# Patient Record
Sex: Male | Born: 1995 | Race: Black or African American | Hispanic: No | Marital: Single | Smoking: Former smoker
Health system: Southern US, Community
[De-identification: ages and names within clinical notes are randomized; demographics above are authoritative.]

---

## 2017-06-22 ENCOUNTER — Other Ambulatory Visit: Payer: Self-pay

## 2017-06-22 ENCOUNTER — Encounter (HOSPITAL_COMMUNITY): Payer: Self-pay

## 2017-06-22 ENCOUNTER — Emergency Department (HOSPITAL_COMMUNITY): Payer: Medicaid Other

## 2017-06-22 ENCOUNTER — Emergency Department (HOSPITAL_COMMUNITY)
Admission: EM | Admit: 2017-06-22 | Discharge: 2017-06-22 | Disposition: A | Discharge status: Home / Self Care | Payer: Medicaid Other | Attending: Emergency Medicine | Admitting: Emergency Medicine

## 2017-06-22 DIAGNOSIS — Z87891 Personal history of nicotine dependence: Secondary | ICD-10-CM | POA: Insufficient documentation

## 2017-06-22 DIAGNOSIS — J069 Acute upper respiratory infection, unspecified: Secondary | ICD-10-CM | POA: Diagnosis not present

## 2017-06-22 DIAGNOSIS — J029 Acute pharyngitis, unspecified: Secondary | ICD-10-CM | POA: Diagnosis present

## 2017-06-22 LAB — RAPID STREP SCREEN (MED CTR MEBANE ONLY): Streptococcus, Group A Screen (Direct): NEGATIVE

## 2017-06-22 MED ORDER — LIDOCAINE VISCOUS 2 % MT SOLN: 15.0000 mL | OROMUCOSAL | 0 refills | Status: AC | PRN

## 2017-06-22 MED ORDER — BENZONATATE 100 MG PO CAPS: 200.0000 mg | ORAL_CAPSULE | Freq: Three times a day (TID) | ORAL | 0 refills | Status: AC

## 2017-06-22 NOTE — Discharge Instructions (Signed)
Please read the attached information regarding your condition. Take Tessalon Perles as needed for cough. Swish and spit lidocaine solution as needed for sore throat. Take ibuprofen or Tylenol as needed for pain and discomfort. Return to ED for worsening sore throat, trouble breathing, trouble swallowing, productive cough, high fevers.

## 2017-06-22 NOTE — ED Provider Notes (Signed)
[facility]-EMERGENCY DEPT Provider Note   CSN: [id number] Arrival date & time: 06/22/17  0901     History   Chief Complaint Chief Complaint  Patient presents with  . Sore Throat  . Cough  . Nausea    HPI Alexander Shaffer is a 21 y.o. male with no significant past medical history, who presents to ED for evaluation of sore throat, cough, right ear pressure and intermittent nausea for the past 3 days.  States that his throat feels scratchy.  He states that his roommate has similar symptoms.  He has tried over-the-counter Zicam with mild relief in his symptoms.  States that he feels like there is drainage down his throat.  He is also been experiencing intermittent chills.  He denies any trouble breathing, trouble swallowing, neck pain, vomiting, abdominal pain, hemoptysis, chest pain or wheezing.  HPI  History reviewed. No pertinent past medical history.  There are no active problems to display for this patient.   History reviewed. No pertinent surgical history.     Home Medications    Prior to Admission medications   Medication Sig Start Date End Date Taking? Authorizing Provider  benzonatate (TESSALON) 100 MG capsule Take 2 capsules (200 mg total) every 8 (eight) hours by mouth. 06/22/17   Sayeed Weatherall, PA-C  lidocaine (XYLOCAINE) 2 % solution Use as directed 15 mLs as needed in the mouth or throat for mouth pain. 06/22/17   Dietrich PatesKhatri, Kahlil Cowans, PA-C    Family History Family History  Problem Relation Age of Onset  . High Cholesterol Mother     Social History Social History   Tobacco Use  . Smoking status: Former Games developermoker  . Smokeless tobacco: Never Used  Substance Use Topics  . Alcohol use: Yes    Comment: rarely  . Drug use: No     Allergies   Patient has no known allergies.   Review of Systems Review of Systems  Constitutional: Positive for chills. Negative for appetite change and fever.  HENT: Positive for ear pain, postnasal drip,  rhinorrhea and sore throat. Negative for drooling, ear discharge, sneezing, trouble swallowing and voice change.   Eyes: Negative for pain and itching.  Respiratory: Positive for cough. Negative for chest tightness, shortness of breath and wheezing.   Cardiovascular: Negative for chest pain.  Gastrointestinal: Positive for nausea. Negative for abdominal pain, constipation, diarrhea and vomiting.  Skin: Negative for rash.  Neurological: Negative for headaches.     Physical Exam Updated Vital Signs BP 136/77 (BP Location: Left Arm)   Pulse 76   Temp 98.7 F (37.1 C) (Oral)   Resp 16   Ht 6\' 1"  (1.854 m)   Wt 72.6 kg (160 lb)   SpO2 99%   BMI 21.11 kg/m   Physical Exam  Constitutional: He appears well-developed and well-nourished. No distress.  Nontoxic appearing and in no acute distress.  No signs of respiratory distress or airway compromise.  HENT:  Head: Normocephalic and atraumatic.  Right Ear: Tympanic membrane normal.  Left Ear: Tympanic membrane normal.  Nose: Mucosal edema present.  Mouth/Throat: Uvula is midline and mucous membranes are normal. Posterior oropharyngeal erythema present. No posterior oropharyngeal edema. No tonsillar exudate.  Patient does not appear to be in acute distress. No trismus or drooling present. No pooling of secretions. Patient is tolerating secretions and is not in respiratory distress. No neck pain or tenderness to palpation of the neck. Full active and passive range of motion of the neck. No evidence of  RPA or PTA.  Eyes: Conjunctivae and EOM are normal. No scleral icterus.  Neck: Normal range of motion.  Cardiovascular: Normal rate, regular rhythm and normal heart sounds.  Pulmonary/Chest: Effort normal. No respiratory distress.  Mild coarse breath sounds noted in right middle field.  Neurological: He is alert.  Skin: No rash noted. He is not diaphoretic.  Psychiatric: He has a normal mood and affect.  Nursing note and vitals  reviewed.    ED Treatments / Results  Labs (all labs ordered are listed, but only abnormal results are displayed) Labs Reviewed  RAPID STREP SCREEN (NOT AT [facility])  CULTURE, GROUP A STREP [facility])    EKG  EKG Interpretation None       Radiology Dg Chest 2 View  Result Date: 06/22/2017 CLINICAL DATA:  Cough with chest soreness and shortness breath worsening over last 2-3 days. EXAM: CHEST  2 VIEW COMPARISON:  None. FINDINGS: The heart size and mediastinal contours are normal. The lungs are clear. There is no pleural effusion or pneumothorax. No acute osseous findings are identified. IMPRESSION: No active cardiopulmonary process. Electronically Signed   By: Carey BullocksWilliam  Veazey M.D.   On: 06/22/2017 12:30    Procedures Procedures (including critical care time)  Medications Ordered in ED Medications - No data to display   Initial Impression / Assessment and Plan / ED Course  I have reviewed the triage vital signs and the nursing notes.  Pertinent labs & imaging results that were available during my care of the patient were reviewed by me and considered in my medical decision making (see chart for details).     Patient presents to ED for evaluation of URI symptoms including sore throat, cough, right ear pressure and intermittent nausea for the past 3 days.  On physical exam he is nontoxic appearing and in no acute distress.  He has no signs of respiratory distress or airway compromise.  He has full active and passive range of motion of the neck with mild erythema of the posterior oropharynx.  I have low suspicion for RPA or PTA being the cause of his sore throat.  Mild coarse breath sounds noted on right side.  He is afebrile with no history of fever.  Strep test returned as negative.  Chest x-ray returned as negative.  Symptoms most likely due to viral URI.  Will place patient on symptomatic treatment with Tessalon Perles and lidocaine solution as needed for throat discomfort.  Encourage  fluids and symptomatic treatment with over-the-counter medications.  Patient appears stable for discharge at this time.  Strict return precautions given.  Final Clinical Impressions(s) / ED Diagnoses   Final diagnoses:  Viral upper respiratory tract infection    ED Discharge Orders        Ordered    benzonatate (TESSALON) 100 MG capsule  Every 8 hours     06/22/17 1248    lidocaine (XYLOCAINE) 2 % solution  As needed     06/22/17 1248       Dietrich PatesKhatri, Deloris Mittag, PA-C 06/22/17 1250    Wynetta FinesMessick, Peter C, MD 06/22/17 1717

## 2017-06-22 NOTE — ED Triage Notes (Signed)
Patient c/o sore throat, nausea and a non productive cough x 3-4 days.

## 2017-06-24 LAB — CULTURE, GROUP A STREP (THRC)

## 2018-01-02 ENCOUNTER — Other Ambulatory Visit: Payer: Self-pay

## 2018-01-02 ENCOUNTER — Emergency Department (HOSPITAL_COMMUNITY)
Admission: EM | Admit: 2018-01-02 | Discharge: 2018-01-02 | Disposition: A | Discharge status: Home / Self Care | Payer: BLUE CROSS/BLUE SHIELD | Attending: Emergency Medicine | Admitting: Emergency Medicine

## 2018-01-02 ENCOUNTER — Encounter (HOSPITAL_COMMUNITY): Payer: Self-pay

## 2018-01-02 DIAGNOSIS — J029 Acute pharyngitis, unspecified: Secondary | ICD-10-CM | POA: Insufficient documentation

## 2018-01-02 DIAGNOSIS — Z87891 Personal history of nicotine dependence: Secondary | ICD-10-CM | POA: Diagnosis not present

## 2018-01-02 LAB — GROUP A STREP BY PCR: GROUP A STREP BY PCR: NOT DETECTED

## 2018-01-02 MED ORDER — DEXAMETHASONE SODIUM PHOSPHATE 10 MG/ML IJ SOLN
10.0000 mg | Freq: Once | INTRAMUSCULAR | Status: AC
  Administered 2018-01-02: 10 mg via INTRAMUSCULAR
  Filled 2018-01-02: qty 1

## 2018-01-02 MED ORDER — ACETAMINOPHEN 500 MG PO TABS: 500.0000 mg | ORAL_TABLET | Freq: Four times a day (QID) | ORAL | 0 refills | Status: AC | PRN

## 2018-01-02 MED ORDER — IBUPROFEN 600 MG PO TABS: 600.0000 mg | ORAL_TABLET | Freq: Four times a day (QID) | ORAL | 0 refills | Status: AC | PRN

## 2018-01-02 NOTE — ED Triage Notes (Signed)
Pt with right sided throat pain for 3-4 days.  Pain radiating to ear.  No sinus drainage.  No dental pain.  Denies fever.  Temp 99.3 in triage.

## 2018-01-02 NOTE — Discharge Instructions (Addendum)
Your strep test today was negative.  You likely have a viral pharyngitis causing your sore throat. Drink plenty of water and get plenty of rest. Gargle warm salt water and spit it out for sore throat. May also use cough drops, warm teas, over-the-counter Chloraseptic spray etc. Alternate 600 mg of ibuprofen and [phone] mg of Tylenol every 3 hours as needed for pain or fever. Do not exceed 4000 mg of Tylenol daily.  Take ibuprofen with food to avoid upset stomach issues.   Followup with your primary care doctor in 5-7 days for recheck of ongoing symptoms. Return to emergency department for emergent changing or worsening of symptoms such as throat tightness, facial swelling, fever not controlled by ibuprofen or Tylenol,difficulty breathing, or chest pain.

## 2018-01-02 NOTE — ED Provider Notes (Signed)
[facility]-EMERGENCY DEPT Provider Note   CSN: [id number] Arrival date & time: 01/02/18  1247     History   Chief Complaint Chief Complaint  Patient presents with  . Sore Throat    HPI Renaud Celli is a 22 y.o. male with no significant past medical history presents today for evaluation of acute onset, per aggressively worsening sore throat for 3 to 4 days.  Pain is worse on the right side with radiation to the right ear.  He endorses pain with swallowing but denies difficulty swallowing, drooling, facial swelling, or throat tightness.  No fevers or chills.  He denies nasal congestion, cough, shortness of breath, or chest pain.  He has tried DayQuil without significant relief of his symptoms, ibuprofen has been helpful in relieving pain.  He notes that his roommate recently had a sore throat for a day or so.  The history is provided by the patient.    History reviewed. No pertinent past medical history.  There are no active problems to display for this patient.   History reviewed. No pertinent surgical history.      Home Medications    Prior to Admission medications   Medication Sig Start Date End Date Taking? Authorizing Provider  Pseudoephedrine-APAP-DM (DAYQUIL PO) Take 30 mLs by mouth as needed (colds).   Yes [provider]  acetaminophen (TYLENOL) 500 MG tablet Take 1 tablet (500 mg total) by mouth every 6 (six) hours as needed. 01/02/18   Heavenlee Maiorana A, PA-C  benzonatate (TESSALON) 100 MG capsule Take 2 capsules (200 mg total) every 8 (eight) hours by mouth. Patient not taking: Reported on 01/02/2018 06/22/17   Dietrich Pates, PA-C  ibuprofen (ADVIL,MOTRIN) 600 MG tablet Take 1 tablet (600 mg total) by mouth every 6 (six) hours as needed. 01/02/18   Earnie Rockhold A, PA-C  lidocaine (XYLOCAINE) 2 % solution Use as directed 15 mLs as needed in the mouth or throat for mouth pain. Patient not taking: Reported on 01/02/2018 06/22/17   Dietrich Pates, PA-C      Family History Family History  Problem Relation Age of Onset  . High Cholesterol Mother     Social History Social History   Tobacco Use  . Smoking status: Former Games developer  . Smokeless tobacco: Never Used  Substance Use Topics  . Alcohol use: Yes    Comment: rarely  . Drug use: No     Allergies   Patient has no known allergies.   Review of Systems Review of Systems  Constitutional: Negative for chills and fever.  HENT: Positive for ear pain and sore throat. Negative for congestion and trouble swallowing.   Respiratory: Negative for shortness of breath.   Cardiovascular: Negative for chest pain.     Physical Exam Updated Vital Signs Pulse 96   Temp 99.3 F (37.4 C) (Oral)   Resp 16   Ht 6' 1.5" (1.867 m)   Wt 73.5 kg (162 lb)   SpO2 100%   BMI 21.08 kg/m   Physical Exam  Constitutional: He appears well-developed and well-nourished. No distress.  Resting comfortably in chair  HENT:  Head: Normocephalic and atraumatic.  Right Ear: Hearing and ear canal normal. No tenderness. A middle ear effusion is present.  Left Ear: Hearing, tympanic membrane and ear canal normal. No tenderness.  No middle ear effusion.  Mouth/Throat: Uvula is midline and mucous membranes are normal. Posterior oropharyngeal erythema present. Tonsils are 3+ on the right. Tonsils are 3+ on the left. No  tonsillar exudate.  Tolerating secretions without difficulty.  No frontal or maxillary sinus tenderness.  Eyes: Pupils are equal, round, and reactive to light. Conjunctivae and EOM are normal. Right eye exhibits no discharge. Left eye exhibits no discharge.  Neck: Normal range of motion and full passive range of motion without pain. Neck supple. No JVD present. No tracheal deviation present.  Right anterior cervical lymphadenopathy  Cardiovascular: Normal rate, regular rhythm and normal heart sounds.  Pulmonary/Chest: Effort normal and breath sounds normal.  Abdominal: Soft. Bowel sounds are  normal. He exhibits no distension.  Musculoskeletal: He exhibits no edema.  Lymphadenopathy:    He has cervical adenopathy.  Neurological: He is alert.  Skin: Skin is warm and dry. No erythema.  Psychiatric: He has a normal mood and affect. His behavior is normal.  Nursing note and vitals reviewed.    ED Treatments / Results  Labs (all labs ordered are listed, but only abnormal results are displayed) Labs Reviewed  GROUP A STREP BY PCR    EKG None  Radiology No results found.  Procedures Procedures (including critical care time)  Medications Ordered in ED Medications  dexamethasone (DECADRON) injection 10 mg (has no administration in time range)     Initial Impression / Assessment and Plan / ED Course  I have reviewed the triage vital signs and the nursing notes.  Pertinent labs & imaging results that were available during my care of the patient were reviewed by me and considered in my medical decision making (see chart for details).     Pt afebrile without tonsillar exudate, negative strep.  He is very well-appearing.  Presents with mild cervical lymphadenopathy, & dysphagia; diagnosis of viral pharyngitis.  No fever or meningeal signs to suggest meningitis.  No mastoid tenderness.  Discussed with patient that antibiotics are not indicated. Pt does not appear dehydrated, but did discuss importance of water rehydration. Presentation non concerning for PTA or infxn spread to soft tissue. No trismus or uvula deviation. Specific return precautions discussed. Pt able to drink water in ED without difficulty with intact air way.  Will discharge with symptomatic treatment.  Recommended PCP follow up. Pt verbalized understanding of and agreement with plan and is safe for discharge home at this time.   Final Clinical Impressions(s) / ED Diagnoses   Final diagnoses:  Viral pharyngitis    ED Discharge Orders        Ordered    ibuprofen (ADVIL,MOTRIN) 600 MG tablet  Every 6  hours PRN     01/02/18 1551    acetaminophen (TYLENOL) 500 MG tablet  Every 6 hours PRN     01/02/18 1551       Jeanie SewerFawze, Shavona Gunderman A, PA-C 01/02/18 1553    Mancel BaleWentz, Elliott, MD 01/02/18 1611

## 2019-04-13 IMAGING — CR DG CHEST 2V
2 series · 2 of 2 positions shown · non-contrast
Comparison: None.

CLINICAL DATA: Cough with chest soreness and shortness breath
worsening over last 2-3 days.

EXAM:
CHEST  2 VIEW

[w chest pa]
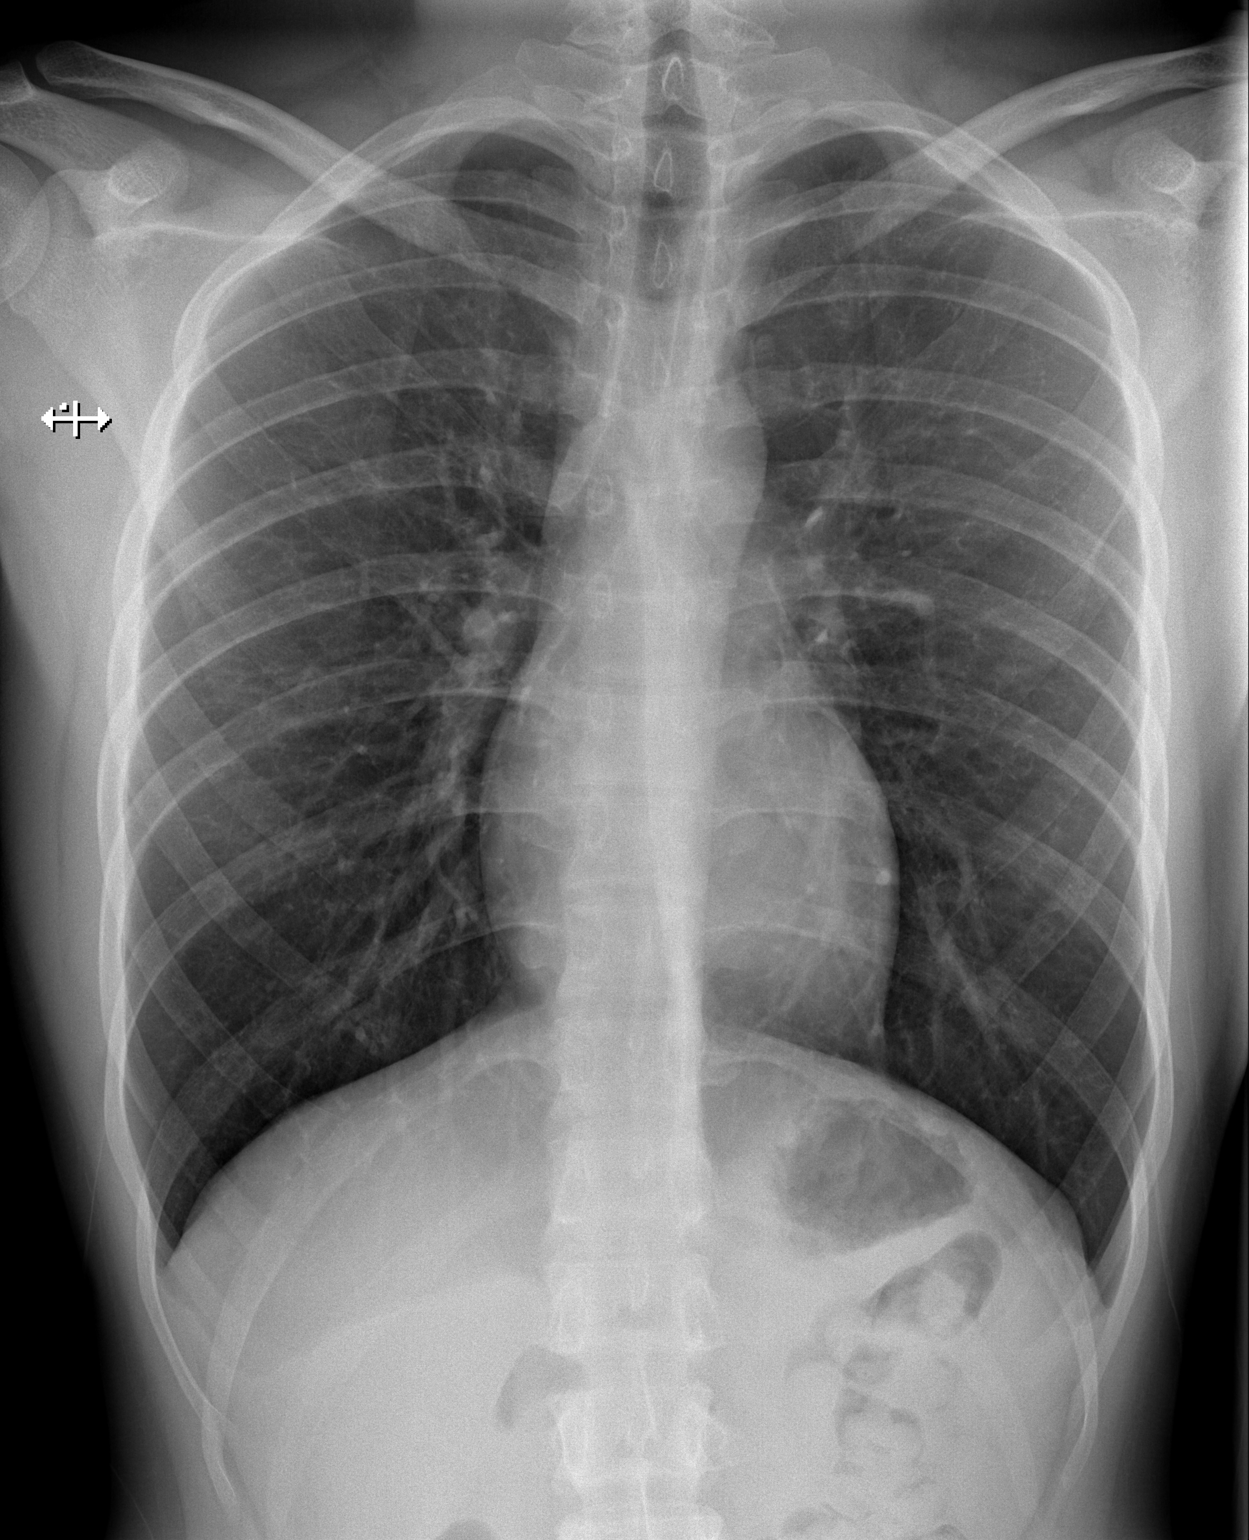

[w chest lat]
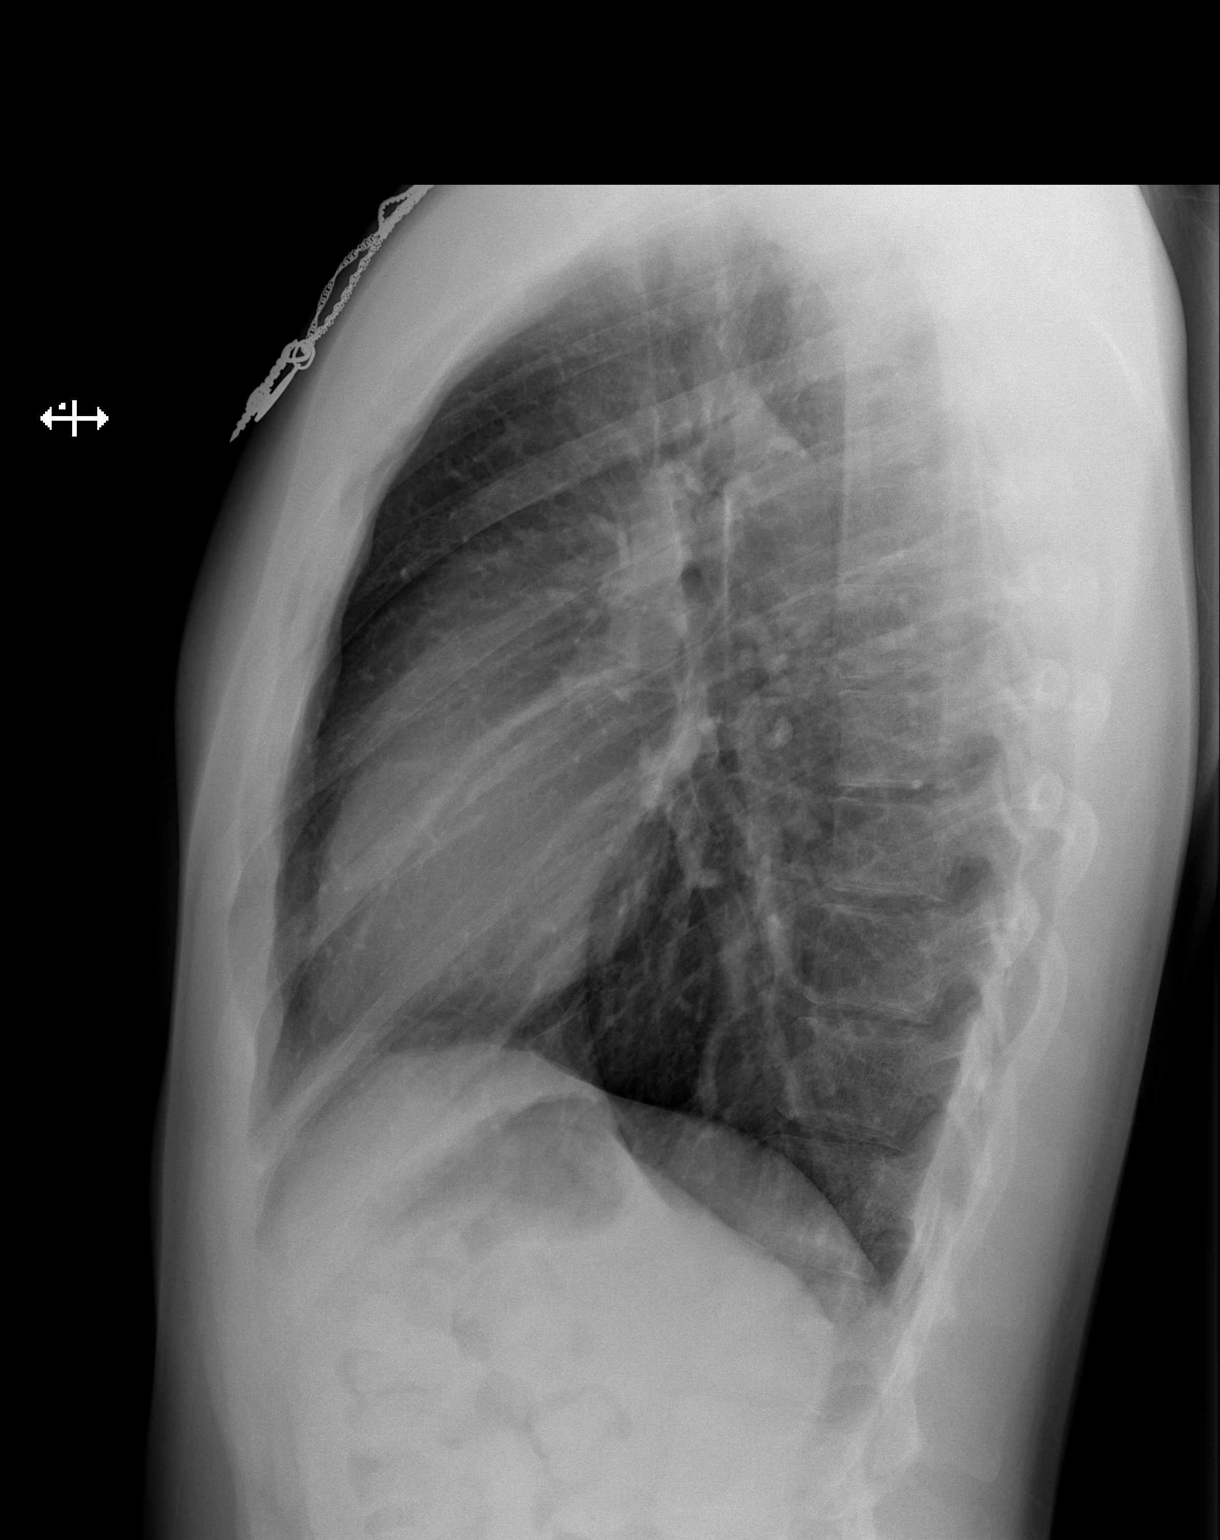

[2 of 2 positions shown; findings below may reference images not displayed]

FINDINGS: The heart size and mediastinal contours are normal. The lungs are
clear. There is no pleural effusion or pneumothorax. No acute
osseous findings are identified.
IMPRESSION: No active cardiopulmonary process.
